# Patient Record
Sex: Male | Born: 1955 | Race: White | Hispanic: No | Marital: Married | Smoking: Former smoker
Health system: Southern US, Community
[De-identification: ages and names within clinical notes are randomized; demographics above are authoritative.]

## PROBLEM LIST (undated history)

## (undated) DIAGNOSIS — N429 Disorder of prostate, unspecified: Secondary | ICD-10-CM

---

## 1998-02-28 ENCOUNTER — Encounter: Admission: RE | Admit: 1998-02-28 | Discharge: 1998-05-29 | Payer: Self-pay | Admitting: Family Medicine

## 2014-02-19 ENCOUNTER — Observation Stay: Payer: Self-pay | Admitting: Internal Medicine

## 2014-02-19 LAB — CBC WITH DIFFERENTIAL/PLATELET
BASOS ABS: 0 10*3/uL (ref 0.0–0.1)
Basophil %: 0.6 %
EOS ABS: 0 10*3/uL (ref 0.0–0.7)
EOS PCT: 0.3 %
HCT: 45.7 % (ref 40.0–52.0)
HGB: 15.3 g/dL (ref 13.0–18.0)
LYMPHS ABS: 0.9 10*3/uL — AB (ref 1.0–3.6)
Lymphocyte %: 12.7 %
MCH: 30.3 pg (ref 26.0–34.0)
MCHC: 33.5 g/dL (ref 32.0–36.0)
MCV: 90 fL (ref 80–100)
MONOS PCT: 5.2 %
Monocyte #: 0.4 x10 3/mm (ref 0.2–1.0)
NEUTROS PCT: 81.2 %
Neutrophil #: 5.8 10*3/uL (ref 1.4–6.5)
PLATELETS: 203 10*3/uL (ref 150–440)
RBC: 5.07 10*6/uL (ref 4.40–5.90)
RDW: 13.2 % (ref 11.5–14.5)
WBC: 7.1 10*3/uL (ref 3.8–10.6)

## 2014-02-19 LAB — BASIC METABOLIC PANEL
ANION GAP: 3 — AB (ref 7–16)
BUN: 21 mg/dL — ABNORMAL HIGH (ref 7–18)
Calcium, Total: 8.5 mg/dL (ref 8.5–10.1)
Chloride: 107 mmol/L (ref 98–107)
Co2: 27 mmol/L (ref 21–32)
Creatinine: 1.22 mg/dL (ref 0.60–1.30)
EGFR (African American): >60
EGFR (Non-African Amer.): >60
GLUCOSE: 129 mg/dL — AB (ref 65–99)
Osmolality: 278 (ref 275–301)
POTASSIUM: 4 mmol/L (ref 3.5–5.1)
Sodium: 137 mmol/L (ref 136–145)

## 2015-03-23 NOTE — H&P (Signed)
PATIENT NAME:  Javier Bell, Javier Bell MR#:  [id number] DATE OF BIRTH:  [birth year]  DATE OF ADMISSION:  02/18/2014  REFERRING PHYSICIAN: Dr. Chiquita LothJade Sung.   PR[facility]YSIC[facility] [place]. Dr. Maryjane HurterFeldpausch.   CHIEF COMPLAINT: Lower back pain.   HISTORY OF PRESENT ILLNESS: This is Bell 59 year old male without significant past medical history, who presents with complaints of lower back pain. The patient reports he was working in the yard this afternoon using Bell chain saw and doing some heavy lifting when he started to have some left-sided lower back pain. The patient reports he had this in the past for Bell few times over the last couple of years in the past, where he had extensive workup done including MRI and CT scans without any acute findings. Reports this pain resembles  previous bout of back pain. He reports was doing some heavy lifting in the yard as well. Reports the pain has been worsening by movement and in Bell sitting position.  The patient reports he took some Percocet at home without any relief, which prompted him to come to the ED. In the ED, the patient had multiple doses of IV pain medicine and diazepam and Percocet without relief of his pain, so hospitalists were requested to admit the patient. The patient has no focal deficits. No tingling, numbness. No urinary retention.   PAST MEDICAL HISTORY:  Benign prostatic hypertrophy.   PAST SURGICAL HISTORY:  1.  Tonsillectomy.  2.  ACL repair.   ALLERGIES: No known drug allergies.   HOME MEDICATIONS: 1.  Aspirin 81 mg daily.  2.  Ibuprofen as needed.  3.  Diazepam 5 mg oral 3 times Bell day as needed.  4.  Flomax 0.4 mg oral daily.   SOCIAL HISTORY: Denies any smoking and drinks alcohol occasionally 2 times Bell week. No illicit drug use.   FAMILY HISTORY: Significant for coronary artery disease in the family, in his father.   REVIEW OF SYSTEMS:  CONSTITUTIONAL:  Denies fever, chills, fatigue, weakness, weight gain, weight loss.   EYES: Denies blurry vision, double vision, inflammation or glaucoma.  ENT: Denies tinnitus, ear pain, hearing loss, epistaxis or discharge.  RESPIRATORY: Denies cough, wheezing, hemoptysis, dyspnea.  CARDIOVASCULAR: Denies chest pain, edema, arrhythmia, palpitations, syncope.  GASTROINTESTINAL: Denies nausea, vomiting, diarrhea, abdominal pain, hematemesis, melena.  GENITOURINARY: Denies dysuria, hematuria, renal colic.   ENDOCRINOLOGY: Denies polyuria or polydipsia, heat or cold intolerance.  HEMATOLOGY: Denies anemia, easy bruising or bleeding diathesis.  INTEGUMENTARY: Denies acne, rash or skin lesion.  MUSCULOSKELETAL: Reports lower back pain. Denies any history of arthritis or swelling or gout.  NEUROLOGIC:  Denies history of TIA, CVA, tremors, ataxia, vertigo, any focal deficit, any focal numbness or any urinary or stool incontinence.  PSYCHIATRIC: Reports history of anxiety. Denies any history of depression, substance abuse, alcohol abuse, insomnia or schizophrenia.   PHYSICAL EXAMINATION: VITAL SIGNS: Temperature 97.6, pulse 52, respiratory rate 22, blood pressure 152/86, saturating 100% on room air.  GENERAL: Well-nourished male who looks comfortable in bed, in no apparent distress.  HEENT: Head atraumatic, normocephalic. Pupils equal, reactive to light. Pink conjunctivae, anicteric sclerae. Moist oral mucosa.  NECK: Supple. No thyromegaly. No JVD.  CHEST: Good air entry bilaterally. No wheezing, rales, rhonchi.  CARDIOVASCULAR: S1, S2 heard. No rubs, murmur or gallops.  ABDOMEN: Soft, nontender, nondistended. Bowel sounds present.  EXTREMITIES: No edema. No clubbing. No cyanosis. Pedal pulses +2 bilaterally, radial pulses +2 bilaterally.  PSYCHIATRIC: Appropriate affect. Awake, alert x 3. Intact  judgment and insight.  NEUROLOGIC: Cranial nerves grossly intact. Motor 5/5.  No focal deficits. Sensation is symmetrical and intact to light touch in bilateral lower and upper extremities.   MUSCULOSKELETAL: The patient has significant lower back pain, tender to palpation mainly in the left lower back. No joint effusion or erythema.   EKG showing normal sinus rhythm at 44 beats per minute with no significant ST or T wave abnormalities.   ASSESSMENT AND PLAN:  1.  Lower back pain. Most likely due to musculoskeletal spasm. The patient required multiple IV pain meds in the ED, so he will be admitted for further IV pain administration. We will keep him on IV morphine and p.r.n. Percocet as well. We will have him on Flexeril p.r.n. as well.  We will consult physical therapy as well. The patient does not have any focal deficits. No tingling. No numbness. No need for further imaging as he had extensive workup in the past including MRI without any significant findings.  2.  Sinus bradycardia. The patient's wife reports he is athletic, bicycling on Bell daily basis. His heart rate usually is on the lower side.  3.  Benign prostatic hypertrophy. Continue with Flomax.  4. Deep vein thrombosis prophylaxis. We will keep the patient on sequential compression devices and ted hose , we will try to ambulate as soon as possible.   TOTAL TIME SPENT ON ADMISSION AND PATIENT CARE: 40 minutes.     ____________________________ Starleen Arms, MD dse:dmm D: 02/19/2014 03:42:48 ET T: 02/19/2014 09:35:19 ET JOB#: [id number]  cc: Starleen Arms, MD, <Dictator> DAWOOD Teena Irani MD ELECTRONICALLY SIGNED 02/19/2014 23:31

## 2015-03-23 NOTE — Discharge Summary (Signed)
PATIENT NAME:  Javier Bell, Dandrea A MR#:  [id number] DATE OF BIRTH:  1956-10-17  DATE OF ADMISSION:  02/19/2014 DATE OF DISCHARGE:  02/20/2014  ADMITTING PHYSICIAN: Starleen Armsawood S Elgergawy, MD  DISCHARGING PHYSICIAN: Enid Baasadhika Deyra Perdomo, MD PRIMARY CARE PHYSICIAN: Dr. Maryjane HurterFeldpausch.  CONSULTATIONS IN THE HOSPITAL: None.   DISCHARGE DIAGNOSES:  1.  Acute-on-chronic low back pain from muscle spasms.  2.  Benign prostatic hypertrophy.  DISCHARGE HOME MEDICATIONS:  1.  Aspirin 81 mg p.o. daily.  2.  Flomax 0.4 mg p.o. daily.  3.  Valium 5 mg three times a day p.r.n. for muscle spasms.  4.  Colace 100 mg p.o. b.i.d. while on narcotic pain medications.  5.  Flexeril 10 mg q.8 hours p.r.n. for muscle spasm.  6.  Percocet 10/325 mg one tablet q.6 hours p.r.n. for pain.  7.  Ibuprofen 400 mg three times a day as needed for pain.   DISCHARGE DIET: Regular diet.   DISCHARGE ACTIVITY: As tolerated.   FOLLOWUP INSTRUCTIONS: PCP followup in 1 to 2 weeks.   LABORATORIES AND IMAGING STUDIES PRIOR TO DISCHARGE: WBC 7.1, hemoglobin 15.3, hematocrit 45.7, platelet count 203.   Sodium 137, potassium 4.0, chloride 107, bicarb 27, BUN 21, creatinine 1.2, glucose 129 and calcium of 8.5.   BRIEF HOSPITAL COURSE: Mr. Javier Bell is a 59 year old Caucasian male with past medical history significant only for BPH admitted for severe low back pain that started after heavy lifting.  1.  Severe low back pain, likely from muscle spasm. The patient had a similar kind of pain before. He denies any popping sound, radiating pain down his legs. No x-rays were done as patient had similar pain and had extensive workup including MRIs and CTs done in the past. The patient is okay with not having any more x-rays at this time. He did work with physical therapy, but he has been getting the IV morphine around the clock while in the hospital. His pain is a little bit better today and he is being discharged on oral pain medications including  Flexeril, Percocet, and also Valium. The patient was recommended to follow up with outpatient physical therapy services if needed. He can also take ibuprofen as needed over the counter for pain.  2.  For his benign prostatic hypertrophy, his Flomax is being continued without any changes.   His course has been otherwise uneventful in the hospital.   DISCHARGE CONDITION: Stable.   DISCHARGE DISPOSITION: Home.   TIME SPENT ON DISCHARGE: 45 minutes.  ____________________________ Enid Baasadhika Shivan Hodes, MD rk:np D: 02/20/2014 14:37:39 ET T: 02/20/2014 17:25:44 ET JOB#: [id number]  cc: Enid Baasadhika Bionca Mckey, MD, <Dictator> Marina Goodellale E. Feldpausch, MD Enid BaasADHIKA Miklos Bidinger MD ELECTRONICALLY SIGNED 02/22/2014 16:07

## 2015-12-10 ENCOUNTER — Encounter: Payer: Self-pay | Admitting: Emergency Medicine

## 2015-12-10 ENCOUNTER — Emergency Department: Payer: BLUE CROSS/BLUE SHIELD

## 2015-12-10 ENCOUNTER — Observation Stay
Admission: EM | Admit: 2015-12-10 | Discharge: 2015-12-11 | Disposition: A | Discharge status: Home / Self Care | Payer: BLUE CROSS/BLUE SHIELD | Attending: Internal Medicine | Admitting: Internal Medicine

## 2015-12-10 DIAGNOSIS — Z79899 Other long term (current) drug therapy: Secondary | ICD-10-CM | POA: Diagnosis not present

## 2015-12-10 DIAGNOSIS — Z87891 Personal history of nicotine dependence: Secondary | ICD-10-CM | POA: Insufficient documentation

## 2015-12-10 DIAGNOSIS — Z8249 Family history of ischemic heart disease and other diseases of the circulatory system: Secondary | ICD-10-CM | POA: Diagnosis not present

## 2015-12-10 DIAGNOSIS — N400 Enlarged prostate without lower urinary tract symptoms: Secondary | ICD-10-CM | POA: Diagnosis not present

## 2015-12-10 DIAGNOSIS — Z7982 Long term (current) use of aspirin: Secondary | ICD-10-CM | POA: Insufficient documentation

## 2015-12-10 DIAGNOSIS — J9811 Atelectasis: Secondary | ICD-10-CM | POA: Diagnosis not present

## 2015-12-10 DIAGNOSIS — M545 Low back pain, unspecified: Secondary | ICD-10-CM

## 2015-12-10 DIAGNOSIS — M549 Dorsalgia, unspecified: Secondary | ICD-10-CM | POA: Diagnosis present

## 2015-12-10 DIAGNOSIS — Z841 Family history of disorders of kidney and ureter: Secondary | ICD-10-CM | POA: Diagnosis not present

## 2015-12-10 HISTORY — DX: Disorder of prostate, unspecified: N42.9

## 2015-12-10 LAB — URINALYSIS COMPLETE WITH MICROSCOPIC (ARMC ONLY)
Bacteria, UA: NONE SEEN
Bilirubin Urine: NEGATIVE
Glucose, UA: NEGATIVE mg/dL
Hgb urine dipstick: NEGATIVE
Ketones, ur: NEGATIVE mg/dL
LEUKOCYTES UA: NEGATIVE
Nitrite: NEGATIVE
Protein, ur: NEGATIVE mg/dL
Specific Gravity, Urine: 1.027 (ref 1.005–1.030)
pH: 6 (ref 5.0–8.0)

## 2015-12-10 LAB — COMPREHENSIVE METABOLIC PANEL
ALT: 19 U/L (ref 17–63)
ANION GAP: 9 (ref 5–15)
AST: 26 U/L (ref 15–41)
Albumin: 4.5 g/dL (ref 3.5–5.0)
Alkaline Phosphatase: 47 U/L (ref 38–126)
BUN: 27 mg/dL — ABNORMAL HIGH (ref 6–20)
CHLORIDE: 105 mmol/L (ref 101–111)
CO2: 23 mmol/L (ref 22–32)
Calcium: 8.9 mg/dL (ref 8.9–10.3)
Creatinine, Ser: 1.24 mg/dL (ref 0.61–1.24)
Glucose, Bld: 138 mg/dL — ABNORMAL HIGH (ref 65–99)
POTASSIUM: 4.6 mmol/L (ref 3.5–5.1)
SODIUM: 137 mmol/L (ref 135–145)
TOTAL PROTEIN: 6.8 g/dL (ref 6.5–8.1)
Total Bilirubin: 0.7 mg/dL (ref 0.3–1.2)

## 2015-12-10 LAB — CBC
HCT: 47.9 % (ref 40.0–52.0)
Hemoglobin: 15.9 g/dL (ref 13.0–18.0)
MCH: 29.7 pg (ref 26.0–34.0)
MCHC: 33.3 g/dL (ref 32.0–36.0)
MCV: 89.1 fL (ref 80.0–100.0)
PLATELETS: 229 10*3/uL (ref 150–440)
RBC: 5.38 MIL/uL (ref 4.40–5.90)
RDW: 13.3 % (ref 11.5–14.5)
WBC: 8 10*3/uL (ref 3.8–10.6)

## 2015-12-10 MED ORDER — ASPIRIN EC 81 MG PO TBEC
81.0000 mg | DELAYED_RELEASE_TABLET | Freq: Every day | ORAL | Status: DC
  Administered 2015-12-10 – 2015-12-11 (×2): 81 mg via ORAL
  Filled 2015-12-10 (×2): qty 1

## 2015-12-10 MED ORDER — CYCLOBENZAPRINE HCL 10 MG PO TABS
5.0000 mg | ORAL_TABLET | Freq: Three times a day (TID) | ORAL | Status: DC
  Administered 2015-12-10 – 2015-12-11 (×2): 5 mg via ORAL
  Filled 2015-12-10: qty 2
  Filled 2015-12-10: qty 1

## 2015-12-10 MED ORDER — ONDANSETRON HCL 4 MG PO TABS: 4.0000 mg | ORAL_TABLET | Freq: Four times a day (QID) | ORAL | Status: DC | PRN

## 2015-12-10 MED ORDER — ONDANSETRON HCL 4 MG/2ML IJ SOLN: 4.0000 mg | Freq: Four times a day (QID) | INTRAMUSCULAR | Status: DC | PRN

## 2015-12-10 MED ORDER — KETOROLAC TROMETHAMINE 30 MG/ML IJ SOLN
30.0000 mg | Freq: Once | INTRAMUSCULAR | Status: AC
Start: 2015-12-10 — End: 2015-12-10
  Administered 2015-12-10: 30 mg via INTRAVENOUS
  Filled 2015-12-10: qty 1

## 2015-12-10 MED ORDER — HYDROMORPHONE HCL 1 MG/ML IJ SOLN
0.5000 mg | Freq: Once | INTRAMUSCULAR | Status: AC
  Administered 2015-12-10: 0.5 mg via INTRAVENOUS

## 2015-12-10 MED ORDER — HYDROMORPHONE HCL 1 MG/ML IJ SOLN
INTRAMUSCULAR | Status: AC
  Administered 2015-12-10: 0.5 mg via INTRAVENOUS
  Filled 2015-12-10: qty 1

## 2015-12-10 MED ORDER — HYDROMORPHONE HCL 1 MG/ML IJ SOLN
1.0000 mg | Freq: Once | INTRAMUSCULAR | Status: AC
  Administered 2015-12-10: 1 mg via INTRAVENOUS
  Filled 2015-12-10: qty 1

## 2015-12-10 MED ORDER — ACETAMINOPHEN 325 MG PO TABS: 650.0000 mg | ORAL_TABLET | Freq: Four times a day (QID) | ORAL | Status: DC | PRN

## 2015-12-10 MED ORDER — ACETAMINOPHEN 650 MG RE SUPP: 650.0000 mg | Freq: Four times a day (QID) | RECTAL | Status: DC | PRN

## 2015-12-10 MED ORDER — HYDROMORPHONE HCL 1 MG/ML IJ SOLN
1.0000 mg | INTRAMUSCULAR | Status: DC | PRN
  Filled 2015-12-10: qty 1

## 2015-12-10 MED ORDER — ENOXAPARIN SODIUM 40 MG/0.4ML ~~LOC~~ SOLN
40.0000 mg | SUBCUTANEOUS | Status: DC
  Filled 2015-12-10: qty 0.4

## 2015-12-10 MED ORDER — HYDROCODONE-ACETAMINOPHEN 5-325 MG PO TABS
1.0000 | ORAL_TABLET | ORAL | Status: DC | PRN
  Administered 2015-12-10 – 2015-12-11 (×2): 2 via ORAL
  Filled 2015-12-10 (×2): qty 2

## 2015-12-10 MED ORDER — TAMSULOSIN HCL 0.4 MG PO CAPS
0.4000 mg | ORAL_CAPSULE | Freq: Every day | ORAL | Status: DC
  Administered 2015-12-11: 0.4 mg via ORAL
  Filled 2015-12-10: qty 1

## 2015-12-10 MED ORDER — PREDNISONE 20 MG PO TABS
60.0000 mg | ORAL_TABLET | Freq: Once | ORAL | Status: AC
  Administered 2015-12-10: 60 mg via ORAL
  Filled 2015-12-10: qty 3

## 2015-12-10 NOTE — ED Notes (Addendum)
Pt to ed with c/o left flank pain that started yesterday,  Denies injury. Reports hx of same.  Pt states "I have had MRI's, CT's, x rays and labs and they never find anything wrong structurally or orthapedically, however the pain is constant and unbearable causing me to feel nauseated"  Pt states "Morphine does not help, only dilaudid helps.  The last time this happened I was admitted for 2 days for pain management"  During triage, pt states " I can not sit in this chair any longer, pt got up from wheelchair without difficulty and laid down in the floor.  Advised pt he could not lay in the floor,  Assisted pt back into wheelchair and blood drawn and sent.  CT at triage for pt now.

## 2015-12-10 NOTE — H&P (Signed)
[facility] [place] [facility]   PATIENT NAME: Javier Bell    MR#:  [id number]  DATE OF BIRTH:  11/20/1956  DATE OF ADMISSION:  12/10/2015  PRIMARY CARE PHYSICIAN: No primary care provider on file.   REQUESTING/REFERRING PHYSICIAN: Dr. Alphonzo Lemmings  CHIEF COMPLAINT:   Chief Complaint  Patient presents with  . Back Pain    HISTORY OF PRESENT ILLNESS:  Javier Bell  is a 60 y.o. male with a known history of BPH, previous history of back pain who presents to the hospital due to left lower back pain that has progressively gotten worse over the past few days. Patient has been hospitalized for similar problem a couple years back and was referred to orthopedics but has not had an official diagnosis. Patient says he gets these intermittent bouts of back pain which resolved within the next few days and he doesn't have any relapse for quite a while. Last time he was admitted for similar issues was in 2015. He attempted using some Advil, expired oxycodone which did not alleviate his symptoms and he came to the ER for further evaluation. He denies any hematuria, urinary incontinence, numbness or tingling or any other associated symptoms. He was given some IV Dilaudid and Toradol in the ER with minimal improvement of his symptoms and therefore hospitalist services were contacted for further treatment and evaluation.  PAST MEDICAL HISTORY:   Past Medical History  Diagnosis Date  . Prostate disorder     PAST SURGICAL HISTORY:  History reviewed. No pertinent past surgical history.  SOCIAL HISTORY:   Social History  Substance Use Topics  . Smoking status: Former Smoker    Types: Cigarettes  . Smokeless tobacco: Not on file  . Alcohol Use: Yes    FAMILY HISTORY:   Family History  Problem Relation Age of Onset  . Kidney failure Mother   . Heart attack Father     DRUG ALLERGIES:  No Known Allergies  REVIEW OF SYSTEMS:   Review of Systems   Constitutional: Negative for fever and weight loss.  HENT: Negative for congestion, nosebleeds and tinnitus.   Eyes: Negative for blurred vision, double vision and redness.  Respiratory: Negative for cough, hemoptysis and shortness of breath.   Cardiovascular: Negative for chest pain, orthopnea, leg swelling and PND.  Gastrointestinal: Negative for nausea, vomiting, abdominal pain, diarrhea and melena.  Genitourinary: Negative for dysuria, urgency and hematuria.  Musculoskeletal: Positive for back pain. Negative for joint pain and falls.  Neurological: Negative for dizziness, tingling, sensory change, focal weakness, seizures, weakness and headaches.  Endo/Heme/Allergies: Negative for polydipsia. Does not bruise/bleed easily.  Psychiatric/Behavioral: Negative for depression and memory loss. The patient is not nervous/anxious.     MEDICATIONS AT HOME:   Prior to Admission medications   Medication Sig Start Date End Date Taking? Authorizing Provider  aspirin EC 81 MG tablet Take 81 mg by mouth daily.   Yes Historical Provider, MD  tamsulosin (FLOMAX) 0.4 MG CAPS capsule Take 0.4 mg by mouth daily.   Yes Historical Provider, MD      VITAL SIGNS:  Blood pressure 116/77, pulse 53, temperature 98.2 F (36.8 C), temperature source Oral, resp. rate 18, height 5\' 11"  (1.803 m), weight 90.719 kg (200 lb), SpO2 97 %.  PHYSICAL EXAMINATION:  Physical Exam  Javier:  60 y.o.-year-old patient lying in the bed with no acute distress.  EYES: Pupils equal, round, reactive to light and accommodation. No scleral icterus. Extraocular muscles intact.  HEENT:  Head atraumatic, normocephalic. Oropharynx and nasopharynx clear. No oropharyngeal erythema, moist oral mucosa  NECK:  Supple, no jugular venous distention. No thyroid enlargement, no tenderness.  LUNGS: Normal breath sounds bilaterally, no wheezing, rales, rhonchi. No use of accessory muscles of respiration.  CARDIOVASCULAR: S1, S2 RRR. No  murmurs, rubs, gallops, clicks.  ABDOMEN: Soft, nontender, nondistended. Bowel sounds present. No organomegaly or mass.  EXTREMITIES: No pedal edema, cyanosis, or clubbing. + 2 pedal & radial pulses b/l.   NEUROLOGIC: Cranial nerves II through XII are intact. No focal Motor or sensory deficits appreciated b/l. PSYCHIATRIC: The patient is alert and oriented x 3. Good affect.  SKIN: No obvious rash, lesion, or ulcer.   LABORATORY PANEL:   CBC  Recent Labs Lab 12/10/15 1157  WBC 8.0  HGB 15.9  HCT 47.9  PLT 229   ------------------------------------------------------------------------------------------------------------------  Chemistries   Recent Labs Lab 12/10/15 1157  NA 137  K 4.6  CL 105  CO2 23  GLUCOSE 138*  BUN 27*  CREATININE 1.24  CALCIUM 8.9  AST 26  ALT 19  ALKPHOS 47  BILITOT 0.7   ------------------------------------------------------------------------------------------------------------------  Cardiac Enzymes No results for input(s): TROPONINI in the last 168 hours. ------------------------------------------------------------------------------------------------------------------  RADIOLOGY:  Ct Renal Stone Study  12/10/2015  CLINICAL DATA:  Left flank pain starting yesterday EXAM: CT ABDOMEN AND PELVIS WITHOUT CONTRAST TECHNIQUE: Multidetector CT imaging of the abdomen and pelvis was performed following the standard protocol without IV contrast. COMPARISON:  None. FINDINGS: Lower chest: 2 mm right middle lobe pulmonary nodule is seen on image 2 series 6 dependent atelectasis noted in the lung bases bilaterally. Hepatobiliary: No focal abnormality in the liver on this study without intravenous contrast. No evidence of hepatomegaly. There is no evidence for gallstones, gallbladder wall thickening, or pericholecystic fluid. No intrahepatic or extrahepatic biliary dilation. Pancreas: No focal mass lesion. No dilatation of the main duct. No intraparenchymal cyst.  No peripancreatic edema. Spleen: No splenomegaly. No focal mass lesion. Adrenals/Urinary Tract: No adrenal nodule or mass. No evidence for stones in either kidney. No secondary changes in either kidney. No ureteral stones or evidence of hydroureter. Urinary bladder has normal CT imaging features. Stomach/Bowel: Stomach is nondistended. No gastric wall thickening. No evidence of outlet obstruction. Duodenum is normally positioned as is the ligament of Treitz. No small bowel wall thickening. No small bowel dilatation. The terminal ileum is normal. The appendix is normal. No gross colonic mass. No colonic wall thickening. No substantial diverticular change. Vascular/Lymphatic: There is abdominal aortic atherosclerosis without aneurysm. There is no gastrohepatic or hepatoduodenal ligament lymphadenopathy. No intraperitoneal or retroperitoneal lymphadenopy. No pelvic sidewall lymphadenopathy. Reproductive: The prostate gland and seminal vesicles have normal imaging features. Other: No intraperitoneal free fluid. 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 Bone windows reveal no worrisome lytic or sclerotic osseous lesions. IMPRESSION: No acute findings in the abdomen or pelvis. Specifically, no findings to explain the patient's history of left flank pain. 2 mm right middle lobe pulmonary nodule. If the patient is at high risk for bronchogenic carcinoma, follow-up chest CT at 1 year is recommended. If the patient is at low risk, no follow-up is needed. This recommendation follows the consensus statement: Guidelines for Management of Small Pulmonary Nodules Detected on CT Scans: A Statement from the Fleischner Society as published in Radiology 2005; 237:395-400. Electronically Signed   By: Kennith Center M.D.   On: 12/10/2015 12:15     IMPRESSION AND PLAN:   60 year old male with past medical  history of BPH who presents to the hospital due to left lower back pain.  #1 intractable back  pain-etiology unclear but suspected to be musculoskeletal in nature. -Patient has had an extensive workup in the past with MRI, CT scans of his back therefore I will not repeat any imaging at this time. He does not have any neurologic symptoms of numbness tingling or sciatica. -He will be admitted to the hospital under observation for pain control -I will start him on some IV Dilaudid, oral oxycodone as needed. I will also start him on some Flexeril as needed. -I will get a physical therapy evaluation. He needs to be referred to orthopedics as an outpatient.  #2 BPH-continue Flomax.    All the records are reviewed and case discussed with ED provider. Management plans discussed with the patient, family and they are in agreement.  CODE STATUS: Full  TOTAL TIME TAKING CARE OF THIS PATIENT: 45 minutes.    Houston SirenSAINANI,Iosefa Weintraub J M.D on 12/10/2015 at 7:13 PM  Between 7am to 6pm - Pager - [phone]  After 6pm go to [email or web address] - password [email or web address]  [facility]  Office  [phone]  CC: Primary care physician; No primary care provider on file.

## 2015-12-10 NOTE — ED Provider Notes (Addendum)
[facility] Emergency Department Provider Note  ____________________________________________   I have reviewed the triage vital signs and the nursing notes.   HISTORY  Chief Complaint Back Pain    HPI Javier Bell is a 60 y.o. male resents today complaining of low back pain on the left.The pain is chronic and recurrent. Patient has had 4 or 5 or more different episodes of this. He has had extensive workup including MRIs. No one can never find any organic pathology. However does require significant pain medication. He tried home oxycodone without any relief. The patient has had no numbness no weakness no radiation, no vomiting no fever no abdominal pain or testicular pain no other concerning symptoms aside from left lower back pain. No incontinence of bowel or bladder. Denies trauma.  Past Medical History  Diagnosis Date  . Prostate disorder     There are no active problems to display for this patient.   History reviewed. No pertinent past surgical history.  No current outpatient prescriptions on file.  Allergies Review of patient's allergies indicates no known allergies.  History reviewed. No pertinent family history.  Social History Social History  Substance Use Topics  . Smoking status: Never Smoker   . Smokeless tobacco: None  . Alcohol Use: Yes    Review of Systems Constitutional: No fever/chills Eyes: No visual changes. ENT: No sore throat. No stiff neck no neck pain Cardiovascular: Denies chest pain. Respiratory: Denies shortness of breath. Gastrointestinal:   no vomiting.  No diarrhea.  No constipation. Genitourinary: Negative for dysuria. Musculoskeletal: Negative lower extremity swelling Skin: Negative for rash. Neurological: Negative for headaches, focal weakness or numbness. 10-point ROS otherwise negative.  ____________________________________________   PHYSICAL EXAM:  VITAL SIGNS: ED Triage Vitals  Enc Vitals  Group     BP 12/10/15 1147 116/72 mmHg     Pulse Rate 12/10/15 1147 48     Resp 12/10/15 1147 20     Temp 12/10/15 1147 98.2 F (36.8 C)     Temp Source 12/10/15 1147 Oral     SpO2 12/10/15 1147 100 %     Weight 12/10/15 1147 200 lb (90.719 kg)     Height 12/10/15 1147 5\' 11"  (1.803 m)     Head Cir --      Peak Flow --      Pain Score 12/10/15 1148 10     Pain Loc --      Pain Edu? --      Excl. in GC? --     Constitutional: Alert and oriented. Well appearing and in no acute distress. Eyes: Conjunctivae are normal. PERRL. EOMI. Head: Atraumatic. Nose: No congestion/rhinnorhea. Mouth/Throat: Mucous membranes are moist.  Oropharynx non-erythematous. Neck: No stridor.   Nontender with no meningismus Cardiovascular: Normal rate, regular rhythm. Grossly normal heart sounds.  Good peripheral circulation. Respiratory: Normal respiratory effort.  No retractions. Lungs CTAB. Abdominal: Soft and nontender. No distention. No guarding no rebound Back:  There is tenderness to palpation in the left paraspinal muscles of the lower back with no midline tenderness there are no lesions noted. there is no CVA tenderness GU: Normal external male genitalia with no tenderness or herniation noted Musculoskeletal: No lower extremity tenderness. No joint effusions, no DVT signs strong distal pulses no edema Neurologic:  Normal speech and language. No gross focal neurologic deficits are appreciated. Patient has 5 out of 5 strength bilateral upper and lower extremity, reflexes symmetric lower showed a strong distal pulses, no saddle anesthesia, rectal  exam deferred. Skin:  Skin is warm, dry and intact. No rash noted. Psychiatric: Mood and affect are normal. Speech and behavior are normal.  ____________________________________________   LABS (all labs ordered are listed, but only abnormal results are displayed)  Labs Reviewed  COMPREHENSIVE METABOLIC PANEL - Abnormal; Notable for the following:     Glucose, Bld 138 (*)    BUN 27 (*)    All other components within normal limits  CBC  URINALYSIS COMPLETEWITH MICROSCOPIC ([facility] ONLY)   ____________________________________________  EKG  I personally interpreted any EKGs ordered by me or triage  ____________________________________________  RADIOLOGY  I reviewed any imaging ordered by me or triage that were performed during my shift ____________________________________________   PROCEDURES  Procedure(s) performed: None  Critical Care performed: None  ____________________________________________   INITIAL IMPRESSION / ASSESSMENT AND PLAN / ED COURSE  Pertinent labs & imaging results that were available during my care of the patient were reviewed by me and considered in my medical decision making (see chart for details).  Patient with very reproducible pain in the low back, he is completely neurologically intact with no evidence of cauda equina syndrome. He is here for pain control which we'll offer him.  ----------------------------------------- 3:49 PM on 12/10/2015 -----------------------------------------  Patient feels better after pain medication states the pain is much more controlled. We have encouraged him to try to ambulate. Patient feels that he may need to be admitted for 3 days to the hospital like last time. I have expanded him that this is not usually the optimal way to manage routine back pain especially given that he has had extensive workup and no evidence of other acute pathology and no evidence of referred abdominal pain no evidence of GU pathology no evidence of AAA no evidence of bony lesions or other lesions on CT no evidence of retroperitoneal bleed and no evidence of neurologic compromise or complication. I think the patient likely will do better at home with pain management the patient feels that he may need to be admitted. In any event, we will see if he can  ambulate.  ----------------------------------------- 4:01 PM on 12/10/2015 -----------------------------------------  Patient able to ambulate with no evidence of difficulty. Felt a little lightheaded.  ----------------------------------------- 5:26 PM on 12/10/2015 -----------------------------------------  Patient is refusing to be discharged she states that he is worried that when he sits up it brings tears to his eyes and his pain is not adequately controlled for disposition. Review the DA database does not show any significant history of narcotic drug abuse. He states he will not be able to sit up and eat at home when he would just have to come right back to the emergency room because of his back pain. He is requesting admission to the hospital. I have explained him that I do not control gets admitted the hospital but I can relay his request on.. ____________________________________________   FINAL CLINICAL IMPRESSION(S) / ED DIAGNOSES  Final diagnoses:  None     Jeanmarie PlantJames A Tahj Njoku, MD 12/10/15 1441  Jeanmarie PlantJames A Mose Colaizzi, MD 12/10/15 1550  Jeanmarie PlantJames A Kathaleen Dudziak, MD 12/10/15 [id number]  Jeanmarie PlantJames A Bryah Ocheltree, MD 12/10/15 [phone]

## 2015-12-10 NOTE — ED Notes (Signed)
Pt states his back pain is worse when he stands up and did notify MD.

## 2015-12-11 LAB — CBC
HCT: 46.7 % (ref 40.0–52.0)
Hemoglobin: 15.5 g/dL (ref 13.0–18.0)
MCH: 30.4 pg (ref 26.0–34.0)
MCHC: 33.1 g/dL (ref 32.0–36.0)
MCV: 91.9 fL (ref 80.0–100.0)
PLATELETS: 206 10*3/uL (ref 150–440)
RBC: 5.08 MIL/uL (ref 4.40–5.90)
RDW: 13.4 % (ref 11.5–14.5)
WBC: 11.1 10*3/uL — AB (ref 3.8–10.6)

## 2015-12-11 LAB — BASIC METABOLIC PANEL
Anion gap: 6 (ref 5–15)
BUN: 22 mg/dL — AB (ref 6–20)
CALCIUM: 8.6 mg/dL — AB (ref 8.9–10.3)
CO2: 25 mmol/L (ref 22–32)
CREATININE: 1.17 mg/dL (ref 0.61–1.24)
Chloride: 105 mmol/L (ref 101–111)
GFR calc Af Amer: >60 mL/min (ref >60)
GLUCOSE: 155 mg/dL — AB (ref 65–99)
Potassium: 4.9 mmol/L (ref 3.5–5.1)
SODIUM: 136 mmol/L (ref 135–145)

## 2015-12-11 MED ORDER — HYDROCODONE-ACETAMINOPHEN 5-325 MG PO TABS: 1.0000 | ORAL_TABLET | ORAL | Status: AC | PRN

## 2015-12-11 MED ORDER — SENNOSIDES-DOCUSATE SODIUM 8.6-50 MG PO TABS: 1.0000 | ORAL_TABLET | Freq: Two times a day (BID) | ORAL | Status: DC

## 2015-12-11 MED ORDER — CYCLOBENZAPRINE HCL 5 MG PO TABS: 5.0000 mg | ORAL_TABLET | Freq: Three times a day (TID) | ORAL | Status: AC

## 2015-12-11 NOTE — Discharge Summary (Signed)
[facility] [place] [facility]   PATIENT NAME: Javier BudDaniel Bell    MR#:  [id number]  DATE OF BIRTH:  08/09/1956  DATE OF ADMISSION:  12/10/2015 ADMITTING PHYSICIAN: Houston SirenVivek J Sainani, MD  DATE OF DISCHARGE: 12/11/15  PRIMARY CARE PHYSICIAN: No primary care provider on file.    ADMISSION DIAGNOSIS:  Left-sided low back pain without sciatica [M54.5]  DISCHARGE DIAGNOSIS:  Low back pain-appears musculoskeletal BPH  SECONDARY DIAGNOSIS:   Past Medical History  Diagnosis Date  . Prostate disorder     HOSPITAL COURSE:   60 year old male with past medical history of BPH who presents to the hospital due to left lower back pain.  #1 intractable back pain-etiology unclear but suspected to be musculoskeletal in nature. -Patient has had an extensive workup in the past with MRI, CT scans of his back therefore will not repeat any imaging at this time. He does not have any neurologic symptoms of numbness tingling or sciatica. -pain much controlled. Pt ambulating by himself. No PT needs. Out pt PT -pr oral oxycodone as needed.  will give him on some Flexeril as needed. -pt advised to see [facility] as an outpatient who he has seen before  #2 BPH-continue Flomax. D/c home CONSULTS OBTAINED:     DRUG ALLERGIES:  No Known Allergies  DISCHARGE MEDICATIONS:   Current Discharge Medication List    START taking these medications   Details  cyclobenzaprine (FLEXERIL) 5 MG tablet Take 1 tablet (5 mg total) by mouth 3 (three) times daily. Qty: 30 tablet, Refills: 0    HYDROcodone-acetaminophen (NORCO/VICODIN) 5-325 MG tablet Take 1-2 tablets by mouth every 4 (four) hours as needed for moderate pain. Qty: 30 tablet, Refills: 0      CONTINUE these medications which have NOT CHANGED   Details  aspirin EC 81 MG tablet Take 81 mg by mouth daily.    tamsulosin (FLOMAX) 0.4 MG CAPS capsule Take 0.4 mg by mouth daily.        If you experience  worsening of your admission symptoms, develop shortness of breath, life threatening emergency, suicidal or homicidal thoughts you must seek medical attention immediately by calling 911 or calling your MD immediately  if symptoms less severe.  You Must read complete instructions/literature along with all the possible adverse reactions/side effects for all the Medicines you take and that have been prescribed to you. Take any new Medicines after you have completely understood and accept all the possible adverse reactions/side effects.   Please note  You were cared for by a hospitalist during your hospital stay. If you have any questions about your discharge medications or the care you received while you were in the hospital after you are discharged, you can call the unit and asked to speak with the hospitalist on call if the hospitalist that took care of you is not available. Once you are discharged, your primary care physician will handle any further medical issues. Please note that NO REFILLS for any discharge medications will be authorized once you are discharged, as it is imperative that you return to your primary care physician (or establish a relationship with a primary care physician if you do not have one) for your aftercare needs so that they can reassess your need for medications and monitor your lab values. Today   SUBJECTIVE   Doing well  VITAL SIGNS:  Blood pressure 136/69, pulse 58, temperature 98.1 F (36.7 C), temperature source Oral, resp. rate 18, height 5\' 11"  (1.803  m), weight 90.719 kg (200 lb), SpO2 98 %.  I/O:   Intake/Output Summary (Last 24 hours) at 12/11/15 1203 Last data filed at 12/11/15 0518  Gross per 24 hour  Intake    360 ml  Output    750 ml  Net   -390 ml    PHYSICAL EXAMINATION:  GENERAL:  60 y.o.-year-old patient lying in the bed with no acute distress.  EYES: Pupils equal, round, reactive to light and accommodation. No scleral icterus. Extraocular  muscles intact.  HEENT: Head atraumatic, normocephalic. Oropharynx and nasopharynx clear.  NECK:  Supple, no jugular venous distention. No thyroid enlargement, no tenderness.  LUNGS: Normal breath sounds bilaterally, no wheezing, rales,rhonchi or crepitation. No use of accessory muscles of respiration.  CARDIOVASCULAR: S1, S2 normal. No murmurs, rubs, or gallops.  ABDOMEN: Soft, non-tender, non-distended. Bowel sounds present. No organomegaly or mass.  EXTREMITIES: No pedal edema, cyanosis, or clubbing.  NEUROLOGIC: Cranial nerves II through XII are intact. Muscle strength 5/5 in all extremities. Sensation intact. Gait not checked.  PSYCHIATRIC: The patient is alert and oriented x 3.  SKIN: No obvious rash, lesion, or ulcer.   DATA REVIEW:   CBC   Recent Labs Lab 12/11/15 0222  WBC 11.1*  HGB 15.5  HCT 46.7  PLT 206    Chemistries   Recent Labs Lab 12/10/15 1157 12/11/15 0222  NA 137 136  K 4.6 4.9  CL 105 105  CO2 23 25  GLUCOSE 138* 155*  BUN 27* 22*  CREATININE 1.24 1.17  CALCIUM 8.9 8.6*  AST 26  --   ALT 19  --   ALKPHOS 47  --   BILITOT 0.7  --     Microbiology Results   No results found for this or any previous visit (from the past 240 hour(s)).  RADIOLOGY:  Ct Renal Stone Study  12/10/2015  CLINICAL DATA:  Left flank pain starting yesterday EXAM: CT ABDOMEN AND PELVIS WITHOUT CONTRAST TECHNIQUE: Multidetector CT imaging of the abdomen and pelvis was performed following the standard protocol without IV contrast. COMPARISON:  None. FINDINGS: Lower chest: 2 mm right middle lobe pulmonary nodule is seen on image 2 series 6 dependent atelectasis noted in the lung bases bilaterally. Hepatobiliary: No focal abnormality in the liver on this study without intravenous contrast. No evidence of hepatomegaly. There is no evidence for gallstones, gallbladder wall thickening, or pericholecystic fluid. No intrahepatic or extrahepatic biliary dilation. Pancreas: No focal mass  lesion. No dilatation of the main duct. No intraparenchymal cyst. No peripancreatic edema. Spleen: No splenomegaly. No focal mass lesion. Adrenals/Urinary Tract: No adrenal nodule or mass. No evidence for stones in either kidney. No secondary changes in either kidney. No ureteral stones or evidence of hydroureter. Urinary bladder has normal CT imaging features. Stomach/Bowel: Stomach is nondistended. No gastric wall thickening. No evidence of outlet obstruction. Duodenum is normally positioned as is the ligament of Treitz. No small bowel wall thickening. No small bowel dilatation. The terminal ileum is normal. The appendix is normal. No gross colonic mass. No colonic wall thickening. No substantial diverticular change. Vascular/Lymphatic: There is abdominal aortic atherosclerosis without aneurysm. There is no gastrohepatic or hepatoduodenal ligament lymphadenopathy. No intraperitoneal or retroperitoneal lymphadenopy. No pelvic sidewall lymphadenopathy. Reproductive: The prostate gland and seminal vesicles have normal imaging features. Other: No intraperitoneal free fluid. 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 Bone windows reveal no worrisome lytic or sclerotic osseous lesions. IMPRESSION: No acute findings in the abdomen or pelvis. Specifically,  no findings to explain the patient's history of left flank pain. 2 mm right middle lobe pulmonary nodule. If the patient is at high risk for bronchogenic carcinoma, follow-up chest CT at 1 year is recommended. If the patient is at low risk, no follow-up is needed. This recommendation follows the consensus statement: Guidelines for Management of Small Pulmonary Nodules Detected on CT Scans: A Statement from the Fleischner Society as published in Radiology 2005; 237:395-400. Electronically Signed   By: Kennith Center M.D.   On: 12/10/2015 12:15     Management plans discussed with the patient, family and they are in agreement.  CODE STATUS:      Code Status Orders        Start     Ordered   12/10/15 2000  Full code   Continuous     12/10/15 1959    Code Status History    Date Active Date Inactive Code Status Order ID Comments User Context   This patient has a current code status but no historical code status.      TOTAL TIME TAKING CARE OF THIS PATIENT: 40 minutes.    Kaysi Ourada M.D on 12/11/2015 at 12:03 PM  Between 7am to 6pm - Pager - [phone] After 6pm go to [email or web address] - password [email or web address] [facility]  [place]  Office  [phone]  CC: Primary care physician; No primary care provider on file.

## 2015-12-11 NOTE — Progress Notes (Signed)
DISCHARGE NOTE:  Pt given discharge instructions and prescriptions. Pt verbalized understanding. Pt wheeled to car by staff.  

## 2015-12-11 NOTE — Evaluation (Signed)
Physical Therapy Evaluation Patient Details Name: Javier Bell MRN: [id number] DOB: 10-07-1956 Today's Date: 12/11/2015   History of Present Illness  Javier Bell is a 60 y.o. male with a known history of BPH, previous history of back pain who presents to the hospital due to left lower back pain that has progressively gotten worse over the past few days. Patient has been hospitalized for similar problem a couple years back and was referred to orthopedics but has not had an official diagnosis. Patient says he gets these intermittent bouts of back pain which resolved within the next few days and he doesn't have any relapse for quite a while. Last time he was admitted for similar issues was in 2015. He attempted using some Advil, expired oxycodone which did not alleviate his symptoms and he came to the ER for further evaluation. He denies any hematuria, urinary incontinence, numbness or tingling or any other associated symptoms. He was given some IV Dilaudid and Toradol in the ER with minimal improvement of his symptoms and therefore hospitalist services were contacted for further treatment and evaluation. Pt reports that low back pain is improving at this time. Pt reports easing factors include pain medications/muscle relaxers, rest, and supine position. Aggravating factors are sitting and standing positions. Otherwise he is unable to identify other aggravating factors. Back pain was insidious onset without any known mechanism for injury. ROS negative for red flags. No hematuria or pain with urination. No radicular symptoms. Denies saddle paresthesia or LE numbness/tingling. No focal weakness. Pt describes pain as "stabbing" and he is able to provoke pain with palpation of area. Pt reports prior similar episodes which self-resolve with pain medication and rest. Most recent episode occurred in 2015.   Clinical Impression  Pt reports considerable improvement in L low back pain at this time however it is  still present. Patient is painful to palpation over L lower back lateral to spine and near iliac crest over erector spinae group/quadratus lumborum. He reports pain with active and passive R lateral flexion and R rotation. Painless L lateral flexion and rotation. Painless lumbar flexion/extension. Decreased lumbar flexion and extension AROM. Negative SLR and negative slump. Negative Hoffman and negative clonus in LE. No LE rigidity or spasticity. Pt does have notable restriction in hamstring length. No reported numbness/tingling in bilateral LE. Pain appears to be most likely musculoskeletal in nature. Discussed the benefit of low back and abdominal strengthening/conditioning to prevent recurrenc. Recommend pt follow-up with outpatient PT for strengthening/stretching to prevent recurrence. No further PT needs identified at this time. Pt is safe to discharge home with wife when medically stable.     Follow Up Recommendations Outpatient PT (For lower back/abdominal strengthening and stretching)    Equipment Recommendations  None recommended by PT    Recommendations for Other Services       Precautions / Restrictions Precautions Precautions: None Restrictions Weight Bearing Restrictions: No      Mobility  Bed Mobility Overal bed mobility: Independent             General bed mobility comments: Good speed and sequencing, no increased pain  Transfers Overall transfer level: Independent Equipment used: None             General transfer comment: Good strength, speed, and sequencing. No pain reported  Ambulation/Gait Ambulation/Gait assistance: Independent Ambulation Distance (Feet): 400 Feet Assistive device: None Gait Pattern/deviations: WFL(Within Functional Limits) Gait velocity: Normal for full community ambulation Gait velocity interpretation: >2.62 ft/sec, indicative of independent community ambulator  General Gait Details: Good speed, step length, and stability. Able to  peform horizontal head turns without LOB. No pain increase reported with ambulation. Good scanning of visual environment. No need for assistive device  Stairs            Wheelchair Mobility    Modified Rankin (Stroke Patients Only)       Balance Overall balance assessment: Independent (Single leg balance >10 seconds)                                           Pertinent Vitals/Pain Pain Assessment: 0-10 Pain Score: 5  Pain Location: L low back pain Pain Descriptors / Indicators: Stabbing Pain Intervention(s): Monitored during session;Premedicated before session    Home Living Family/patient expects to be discharged to:: Private residence Living Arrangements: Spouse/significant other Available Help at Discharge: Family Type of Home: House Home Access: Stairs to enter Entrance Stairs-Rails: None Technical brewer of Steps: 2 Home Layout: Multi-level Home Equipment: None      Prior Function Level of Independence: Independent         Comments: Fully independnet with ADLs/IADLs, driving and workn     Hand Dominance        Extremity/Trunk Assessment   Upper Extremity Assessment: Overall WFL for tasks assessed           Lower Extremity Assessment: Overall WFL for tasks assessed         Communication   Communication: No difficulties  Cognition Arousal/Alertness: Awake/alert Behavior During Therapy: WFL for tasks assessed/performed Overall Cognitive Status: Within Functional Limits for tasks assessed                      General Comments      Exercises        Assessment/Plan    PT Assessment All further PT needs can be met in the next venue of care  PT Diagnosis Acute pain   PT Problem List Pain  PT Treatment Interventions     PT Goals (Current goals can be found in the Care Plan section) Acute Rehab PT Goals Patient Stated Goal: Return home without pain PT Goal Formulation: With patient Time For Goal  Achievement: 12/25/15 Potential to Achieve Goals: Good    Frequency     Barriers to discharge        Co-evaluation               End of Session Equipment Utilized During Treatment: Gait belt Activity Tolerance: Patient tolerated treatment well Patient left: in bed      Functional Assessment Tool Used: clinical judgement Functional Limitation: Mobility: Walking and moving around Mobility: Walking and Moving Around Current Status (E2800): 0 percent impaired, limited or restricted Mobility: Walking and Moving Around Goal Status [phone]): 0 percent impaired, limited or restricted Mobility: Walking and Moving Around Discharge Status [phone]): 0 percent impaired, limited or restricted    Time: 6979-4801 PT Time Calculation (min) (ACUTE ONLY): 16 min   Charges:   PT Evaluation $PT Eval Low Complexity: 1 Procedure     PT G Codes:   PT G-Codes **NOT FOR INPATIENT CLASS** Functional Assessment Tool Used: clinical judgement Functional Limitation: Mobility: Walking and moving around Mobility: Walking and Moving Around Current Status (K5537): 0 percent impaired, limited or restricted Mobility: Walking and Moving Around Goal Status (S8270): 0 percent impaired, limited or restricted Mobility: Walking  and Moving Around Discharge Status [phone]): 0 percent impaired, limited or restricted   Phillips Grout PT, DPT   Huprich,Jason 12/11/2015, 11:02 AM

## 2015-12-11 NOTE — Care Management Note (Signed)
Case Management Note  Patient Details  Name: Javier Bell MRN: [id number] Date of Birth: [birth year]  Subjective/Objective:                  Met with patient to discuss discharge planning. He states he is usually independent with mobility and drives. He states "this has happened before and usually lasts about 4 days- dilaudid is the only thing that helps". He states he lives with his wife. He states he may have crutches at home but that is the only DME he is aware of. He uses [employer] for [employer]. He PCP is with [facility]. He denies home health/DME needs.   Action/Plan: No RNCM needs at this time.    Expected Discharge Date:                  Expected Discharge Plan:     In-House Referral:     Discharge planning Services  CM Consult  Post Acute Care Choice:    Choice offered to:  Patient  DME Arranged:    DME Agency:     HH Arranged:    [place] Agency:     Status of Service:  Completed, signed off  Medicare Important Message Given:    Date Medicare IM Given:    Medicare IM give by:    Date Additional Medicare IM Given:    Additional Medicare Important Message give by:     If discussed at [place] of Stay Meetings, dates discussed:    Additional Comments:  Marshell Garfinkel, RN 12/11/2015, 9:04 AM

## 2015-12-11 NOTE — Progress Notes (Signed)
   12/11/15 1015  Clinical Encounter Type  Visited With Patient  Visit Type Initial  Consult/Referral To Chaplain  Stress Factors  Patient Stress Factors None identified  Chaplain rounded in unit and offered pastoral care to patient.   Chaplain Lillieanna Tuohy [phone]

## 2016-12-14 IMAGING — CT CT RENAL STONE PROTOCOL
1 of 3 series · 14 of 32 positions shown, 18 images · non-contrast
Comparison: None.

CLINICAL DATA: Left flank pain starting yesterday

EXAM:
CT ABDOMEN AND PELVIS WITHOUT CONTRAST
TECHNIQUE: Multidetector CT imaging of the abdomen and pelvis was performed
following the standard protocol without IV contrast.

[Series 2: stone standard full · axial · 0.72mm/px · z∈[-557,-117]mm · 14 of 98 slices shown, 18 images]
[im 5/98  soft-tissue]
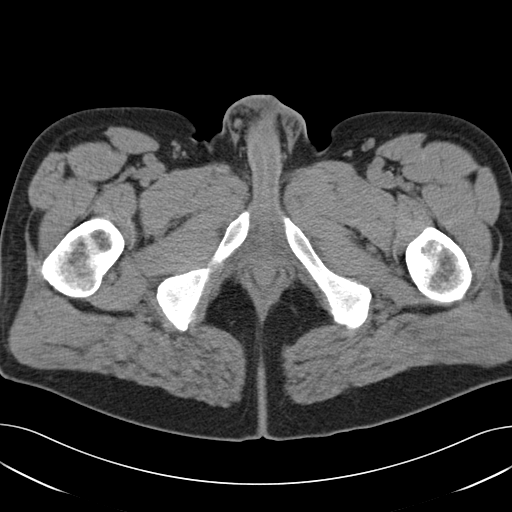
[im 5/98  bone]
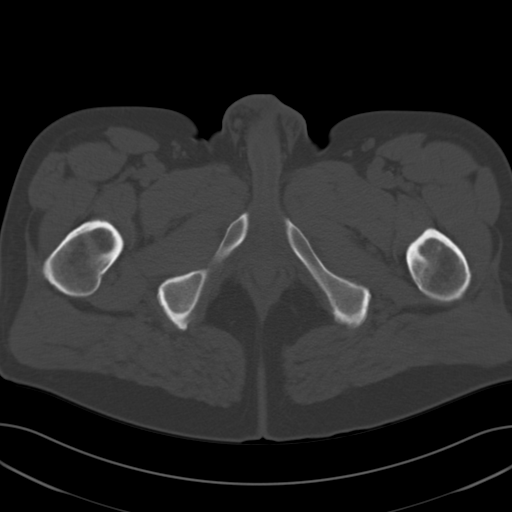
[im 14/98  soft-tissue]
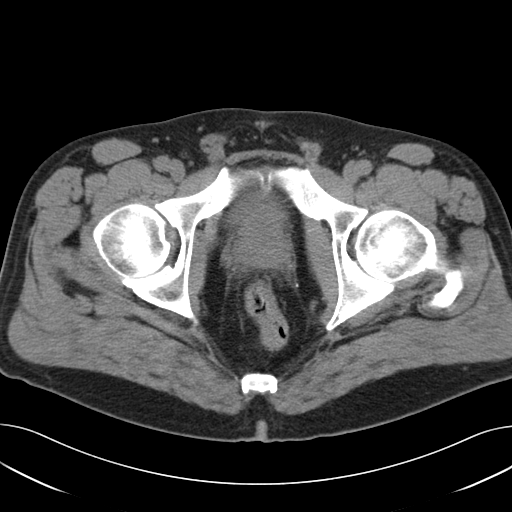
[im 24/98  soft-tissue]
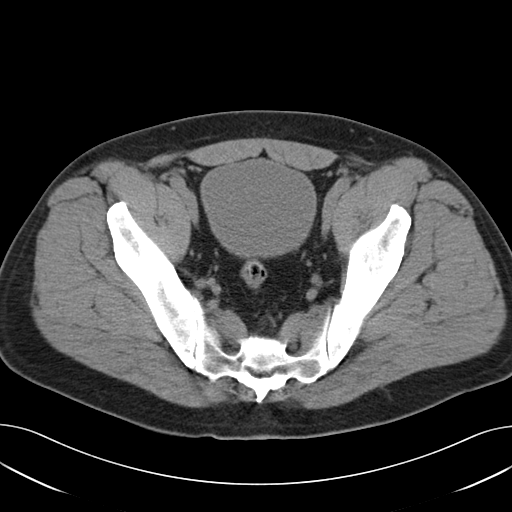
[im 28/98  soft-tissue]
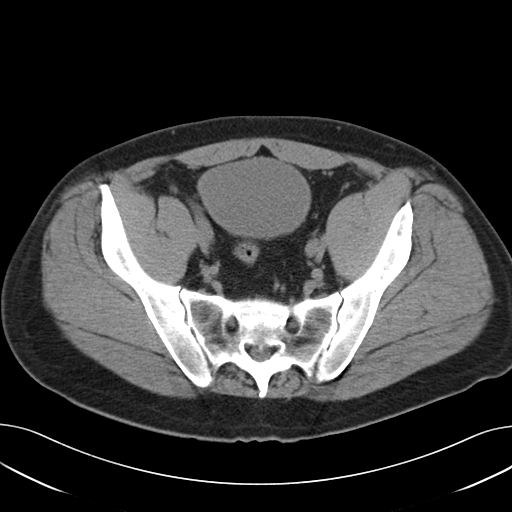
[im 37/98  soft-tissue]
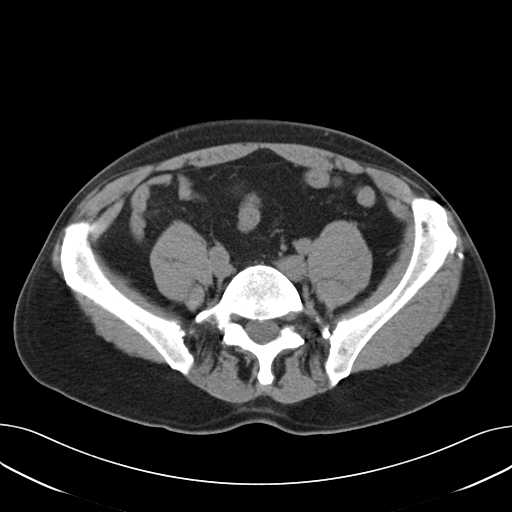
[im 47/98  soft-tissue]
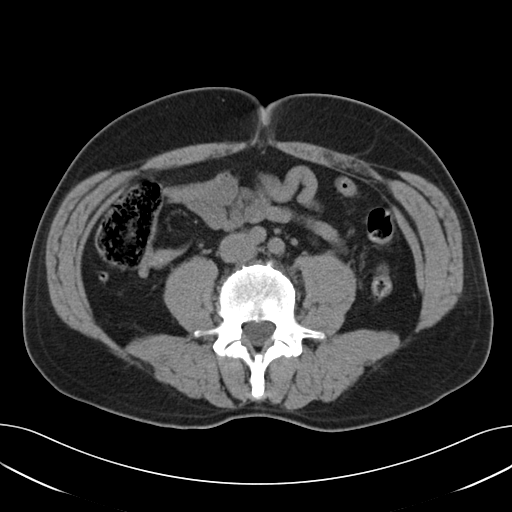
[im 51/98  soft-tissue]
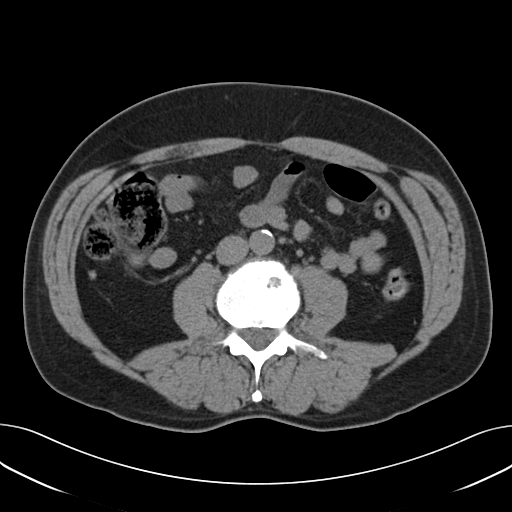
[im 61/98  soft-tissue]
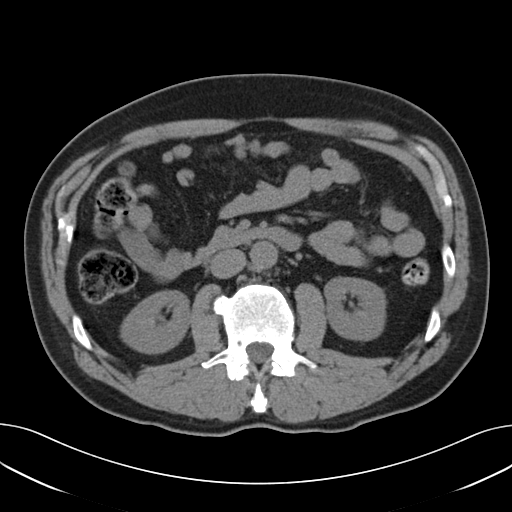
[im 70/98  soft-tissue]
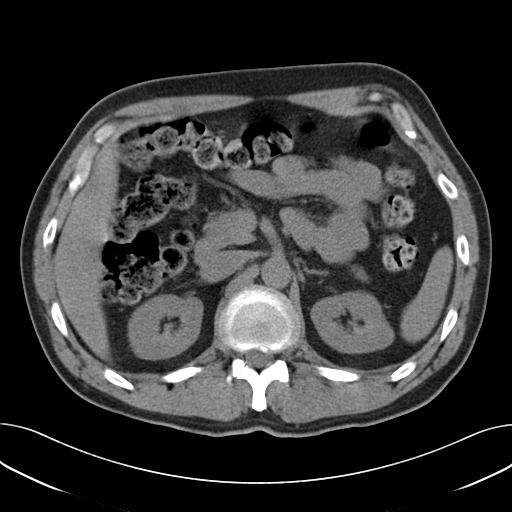
[im 70/98  bone]
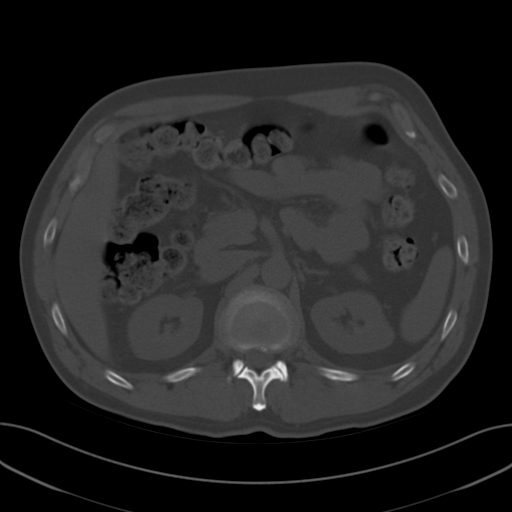
[im 74/98  soft-tissue]
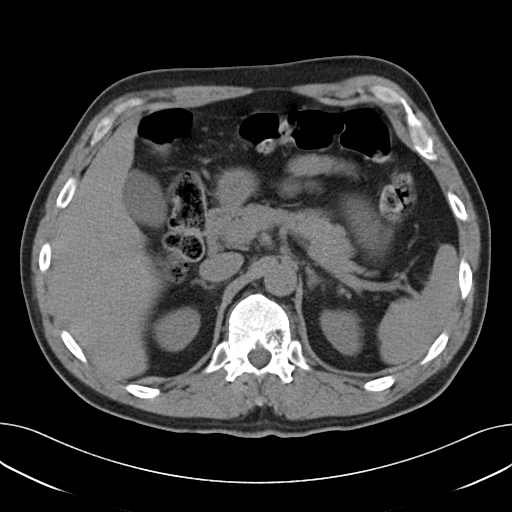
[im 79/98  lung]
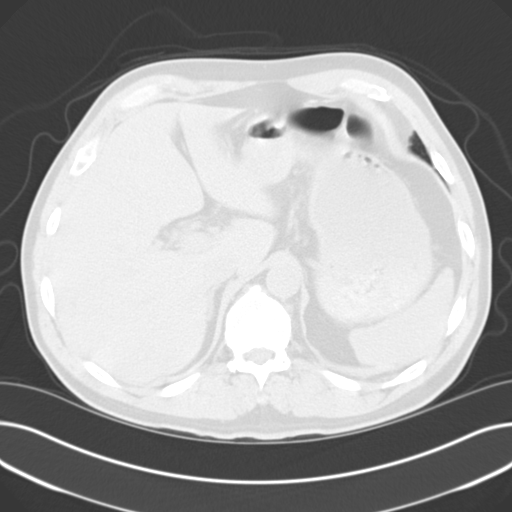
[im 84/98  soft-tissue]
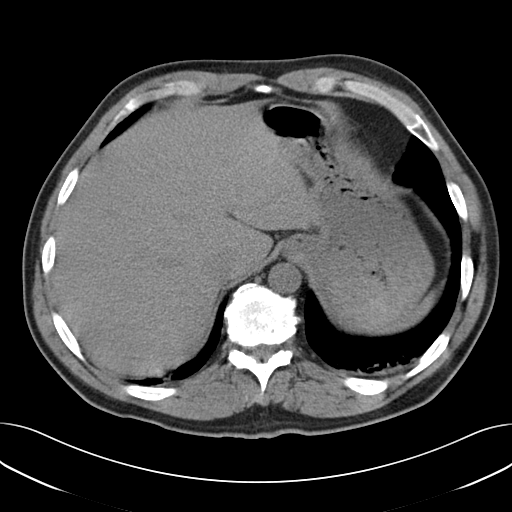
[im 84/98  lung]
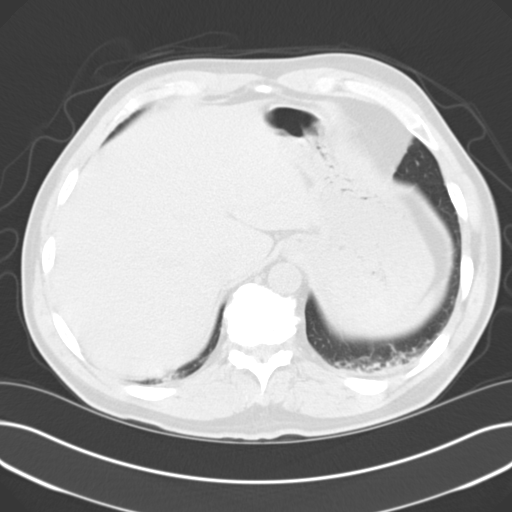
[im 88/98  lung]
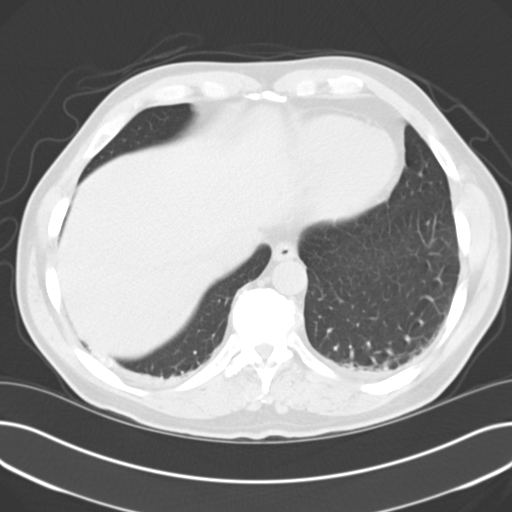
[im 93/98  soft-tissue]
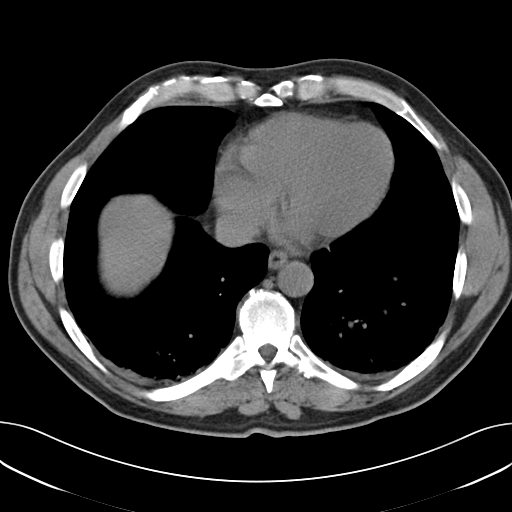
[im 93/98  lung]
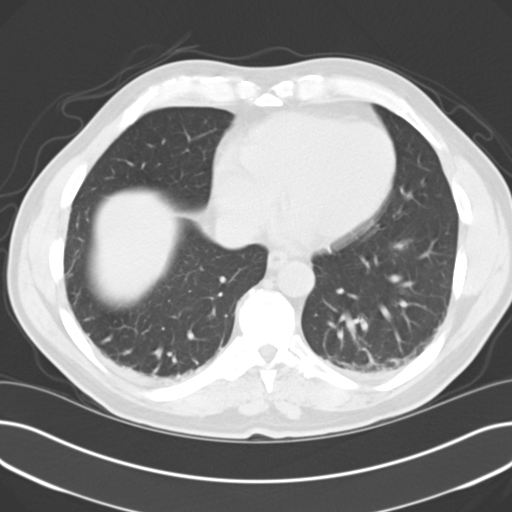

[14 of 32 positions shown; findings below may reference images not displayed]

FINDINGS: Lower chest: 2 mm right middle lobe pulmonary nodule is seen on
image 2 series 6 dependent atelectasis noted in the lung bases
bilaterally.

Hepatobiliary: No focal abnormality in the liver on this study
without intravenous contrast. No evidence of hepatomegaly. There is
no evidence for gallstones, gallbladder wall thickening, or
pericholecystic fluid. No intrahepatic or extrahepatic biliary
dilation.

Pancreas: No focal mass lesion. No dilatation of the main duct. No
intraparenchymal cyst. No peripancreatic edema.

Spleen: No splenomegaly. No focal mass lesion.

Adrenals/Urinary Tract: No adrenal nodule or mass. No evidence for
stones in either kidney. No secondary changes in either kidney. No
ureteral stones or evidence of hydroureter. Urinary bladder has
normal CT imaging features.

Stomach/Bowel: Stomach is nondistended. No gastric wall thickening.
No evidence of outlet obstruction. Duodenum is normally positioned
as is the ligament of Treitz. No small bowel wall thickening. No
small bowel dilatation. The terminal ileum is normal. The appendix
is normal. No gross colonic mass. No colonic wall thickening. No
substantial diverticular change.

Vascular/Lymphatic: There is abdominal aortic atherosclerosis
without aneurysm. There is no gastrohepatic or hepatoduodenal
ligament lymphadenopathy. No intraperitoneal or retroperitoneal
lymphadenopy. No pelvic sidewall lymphadenopathy.

Reproductive: The prostate gland and seminal vesicles have normal
imaging features.

Other: No intraperitoneal free fluid.

Musculoskeletal: Cavernous hemangioma noted in the T12 vertebral
body. Bone windows reveal no worrisome lytic or sclerotic osseous
lesions.
IMPRESSION: No acute findings in the abdomen or pelvis. Specifically, no
findings to explain the patient's history of left flank pain.

2 mm right middle lobe pulmonary nodule. If the patient is at high
risk for bronchogenic carcinoma, follow-up chest CT at 1 year is
recommended. If the patient is at low risk, no follow-up is needed.
This recommendation follows the consensus statement: Guidelines for
Management of Small Pulmonary Nodules Detected on CT Scans: A
Statement from the [HOSPITAL] as published in Radiology

## 2021-05-07 ENCOUNTER — Other Ambulatory Visit: Payer: Self-pay | Admitting: Family Medicine

## 2021-05-07 ENCOUNTER — Other Ambulatory Visit (HOSPITAL_COMMUNITY): Payer: Self-pay | Admitting: Family Medicine

## 2021-05-07 DIAGNOSIS — R918 Other nonspecific abnormal finding of lung field: Secondary | ICD-10-CM
